# Patient Record
Sex: Female | Born: 1990 | Race: White | Hispanic: No | Marital: Single | Smoking: Never smoker
Health system: Southern US, Community
[De-identification: ages and names within clinical notes are randomized; demographics above are authoritative.]

---

## 2013-03-17 ENCOUNTER — Encounter (HOSPITAL_COMMUNITY): Payer: Self-pay | Admitting: Emergency Medicine

## 2013-03-17 ENCOUNTER — Emergency Department (HOSPITAL_COMMUNITY)
Admission: EM | Admit: 2013-03-17 | Discharge: 2013-03-17 | Disposition: A | Discharge status: Home / Self Care | Payer: Federal, State, Local not specified - PPO | Attending: Emergency Medicine | Admitting: Emergency Medicine

## 2013-03-17 DIAGNOSIS — H5789 Other specified disorders of eye and adnexa: Secondary | ICD-10-CM | POA: Insufficient documentation

## 2013-03-17 DIAGNOSIS — H5710 Ocular pain, unspecified eye: Secondary | ICD-10-CM | POA: Insufficient documentation

## 2013-03-17 MED ORDER — FLUORESCEIN SODIUM 1 MG OP STRP
1.0000 | ORAL_STRIP | Freq: Once | OPHTHALMIC | Status: AC
  Administered 2013-03-17: 1 via OPHTHALMIC
  Filled 2013-03-17: qty 1

## 2013-03-17 MED ORDER — POLYMYXIN B-TRIMETHOPRIM 10000-0.1 UNIT/ML-% OP SOLN
2.0000 [drp] | OPHTHALMIC | Status: AC
  Administered 2013-03-17: 2 [drp] via OPHTHALMIC
  Filled 2013-03-17: qty 10

## 2013-03-17 MED ORDER — TETRACAINE HCL 0.5 % OP SOLN
2.0000 [drp] | Freq: Once | OPHTHALMIC | Status: AC
  Administered 2013-03-17: 2 [drp] via OPHTHALMIC
  Filled 2013-03-17: qty 2

## 2013-03-17 NOTE — Discharge Instructions (Signed)
PLEASE FOLLOW UP WITH DR. SPENCER (CALL HIS OFFICE FOR AN APPT) IF YOUR SYMPTOMS WORSEN OR DO NOT IMPROVE SUBSTANTIALLY OVER THE NEXT 48 HRS.

## 2013-03-17 NOTE — ED Provider Notes (Signed)
CSN: [id number]     Arrival date & time 03/17/13  0421 History   First MD Initiated Contact with Patient 03/17/13 [phone]     Chief Complaint  Patient presents with  . Eye Pain     (Consider location/radiation/quality/duration/timing/severity/associated sxs/prior Treatment) HPI This patient is a very pleasant 23 year old woman who wears corrective lenses. She presents with complaints of foreign body sensation in the left eye. She was using an exfoliant facial cream and says that some of the abrasive substance got into her left eye. This occurred last night as she was getting ready for bed. She flushed her left eye with contact solution. However, she notes that she has not worn her contact lenses for several weeks.   The patient says she had a lingering FB sensation - "kind of stinging" so she felt she should get checked out. She denies any visual changes.   History reviewed. No pertinent past medical history. History reviewed. No pertinent past surgical history. Family History  Problem Relation Age of Onset  . Hyperlipidemia Father    History  Substance Use Topics  . Smoking status: Never Smoker   . Smokeless tobacco: Not on file  . Alcohol Use: Yes   OB History   Grav Para Term Preterm Abortions TAB SAB Ect Mult Living                 Review of Systems  Ten point review of symptoms performed and is negative with the exception of symptoms noted above.     Allergies  Review of patient's allergies indicates no known allergies.  Home Medications  No current outpatient prescriptions on file. BP 109/76  Pulse 99  Temp(Src) 96.9 F (36.1 C) (Oral)  Resp 18  SpO2 97%  LMP 03/03/2013 Physical Exam Gen: well developed and well nourished appearing Head: NCAT Eyes: PERL, EOMI, conjunctiva normal to inspection, sclera anicteric.  Nose: normal to inspection Neck: normal to inspection Skin: warm and dry Ext: normal to inspection,  Neuro: CN ii-xii grossly intact, no focal  deficits Psyche; normal affect,  calm and cooperative.   ED Course  Procedures (including critical care time)  Left eye anesthetized with 2 gtt of tetracaine opthalmic solution then painted with fluorocein strip. No uptake of flouro with Wood's lamp exam. No foreign body.    MDM   Left eye pain following foreign body exposure without signs of residual foreign body or corneal abrasion.    Brandt LoosenJulie Manly, MD 03/17/13 [phone]

## 2013-03-17 NOTE — ED Notes (Signed)
Pt states facial wash in left eye yesterday. Pt continues to complain of pain and feels as if fb in eye. Minimal redness noted. Pt denies increase in tearing, discharge, or visual changes.

## 2013-03-17 NOTE — ED Notes (Signed)
Pt alert x4. Respirations easy non labored. Denies complaints at this time.

## 2013-03-17 NOTE — ED Notes (Signed)
Pharmacy aware of polytrim order.

## 2014-03-17 ENCOUNTER — Other Ambulatory Visit: Payer: Self-pay | Admitting: Family Medicine

## 2014-03-17 ENCOUNTER — Other Ambulatory Visit: Payer: Self-pay | Admitting: Internal Medicine

## 2014-03-17 DIAGNOSIS — R102 Pelvic and perineal pain: Secondary | ICD-10-CM

## 2014-03-23 ENCOUNTER — Other Ambulatory Visit: Payer: Self-pay | Admitting: Family Medicine

## 2014-03-23 ENCOUNTER — Ambulatory Visit
Admission: RE | Admit: 2014-03-23 | Discharge: 2014-03-23 | Disposition: A | Discharge status: Home / Self Care | Payer: Federal, State, Local not specified - PPO | Source: Ambulatory Visit | Attending: Family Medicine | Admitting: Family Medicine

## 2014-03-23 DIAGNOSIS — R102 Pelvic and perineal pain: Secondary | ICD-10-CM

## 2014-03-23 DIAGNOSIS — Z30431 Encounter for routine checking of intrauterine contraceptive device: Secondary | ICD-10-CM

## 2014-03-23 DIAGNOSIS — N941 Unspecified dyspareunia: Secondary | ICD-10-CM

## 2015-08-12 IMAGING — US US TRANSVAGINAL NON-OB
1 series · 14 of 25 positions shown · non-contrast
Comparison: None

CLINICAL DATA: Pelvic pain and dyspareunia, check IUD placement



[Series 1: us transvaginal non-ob · 0.35mm/px · 14 of 64 slices shown]
[im 1/64]
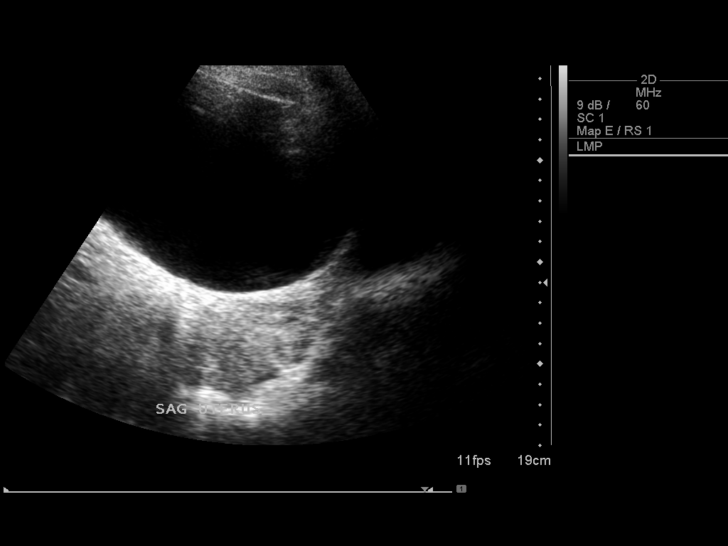
[im 6/64]
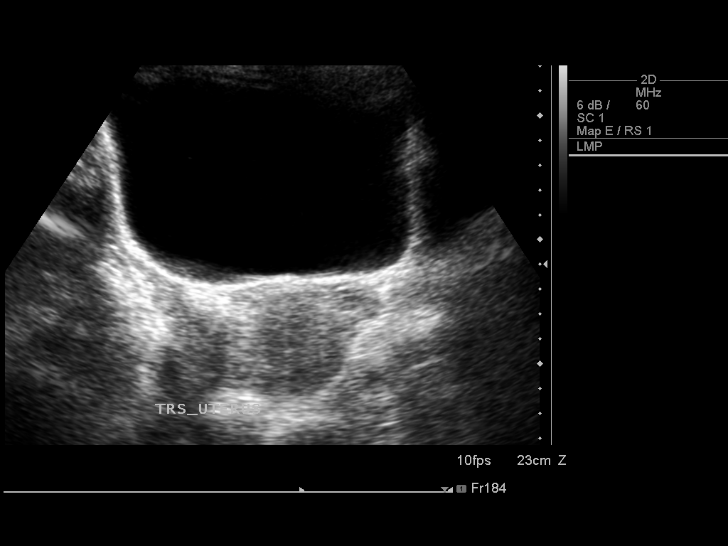
[im 11/64]
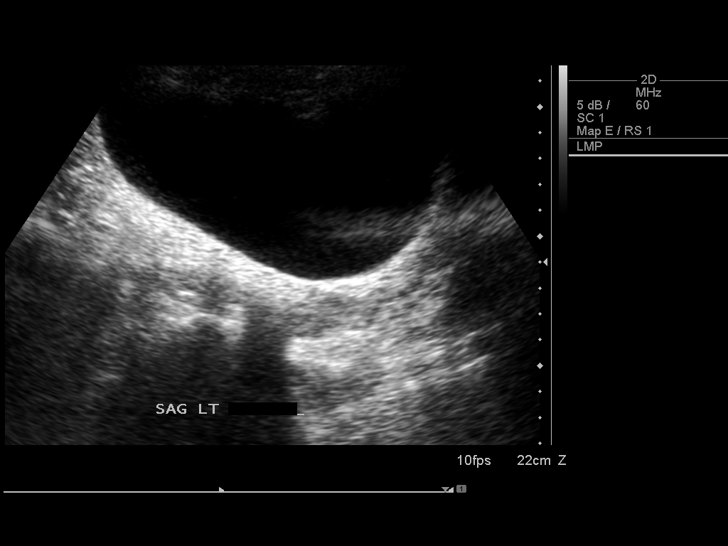
[im 16/64]
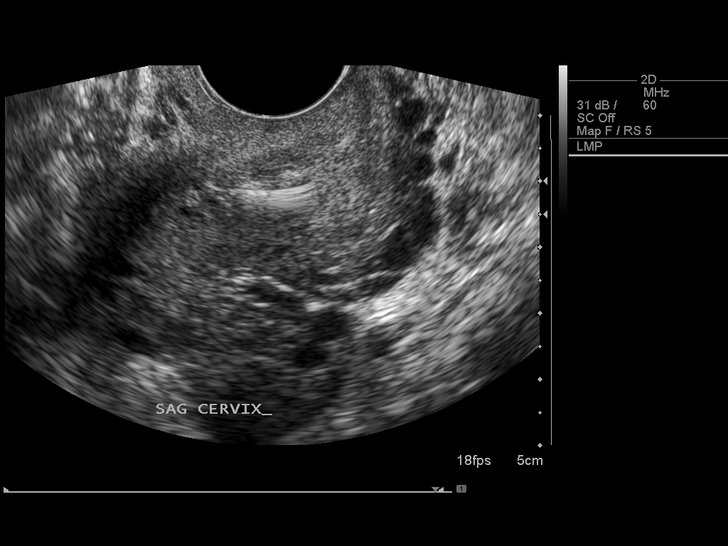
[im 22/64]
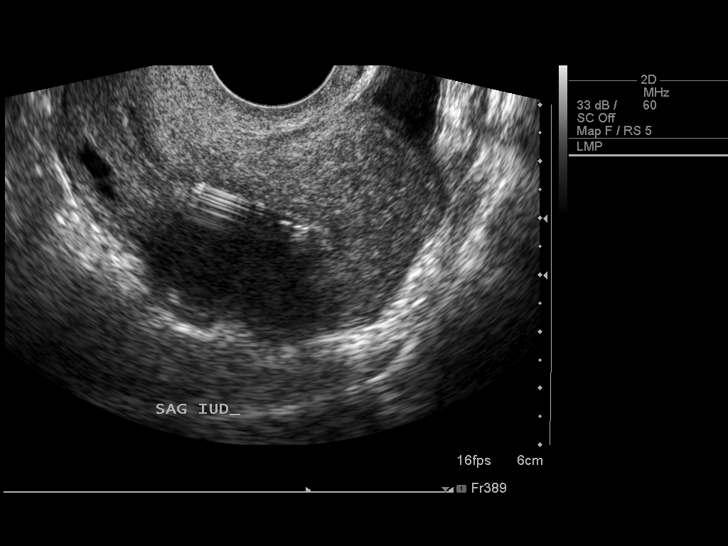
[im 24/64]
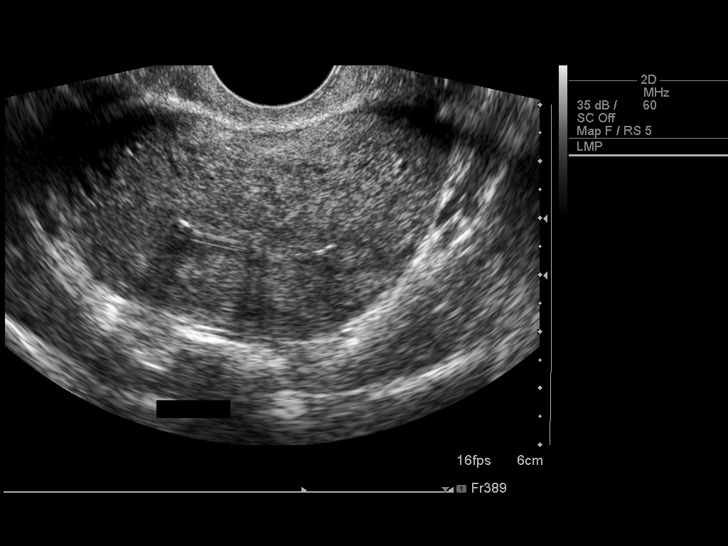
[im 29/64]
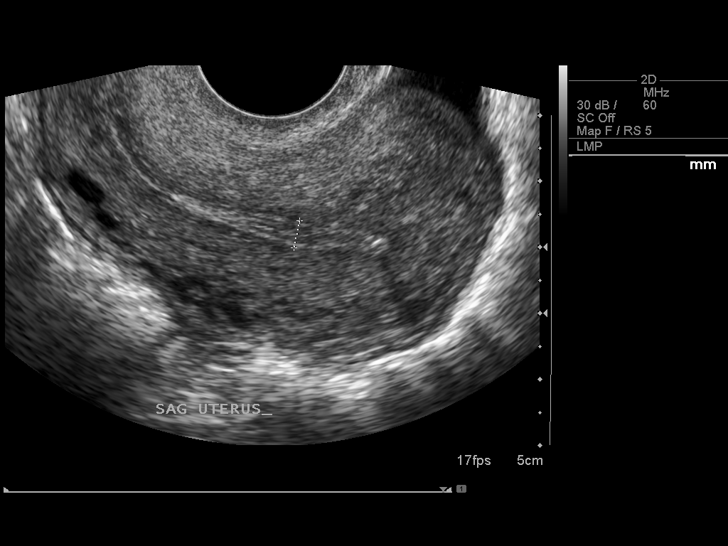
[im 35/64]
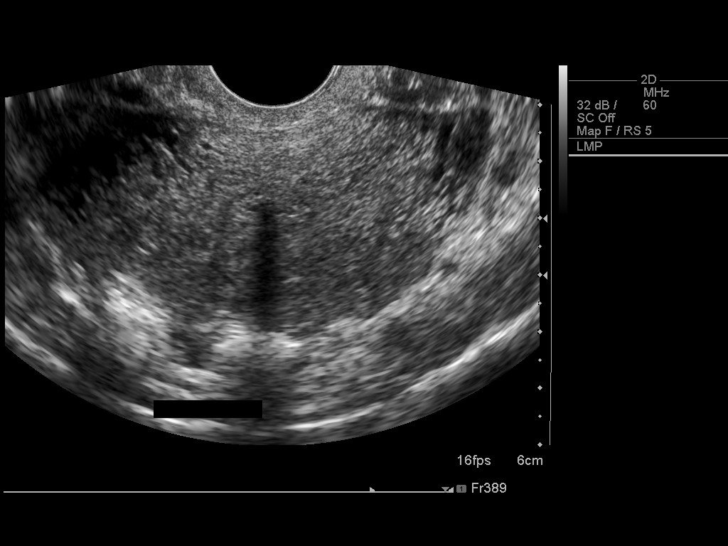
[im 40/64]
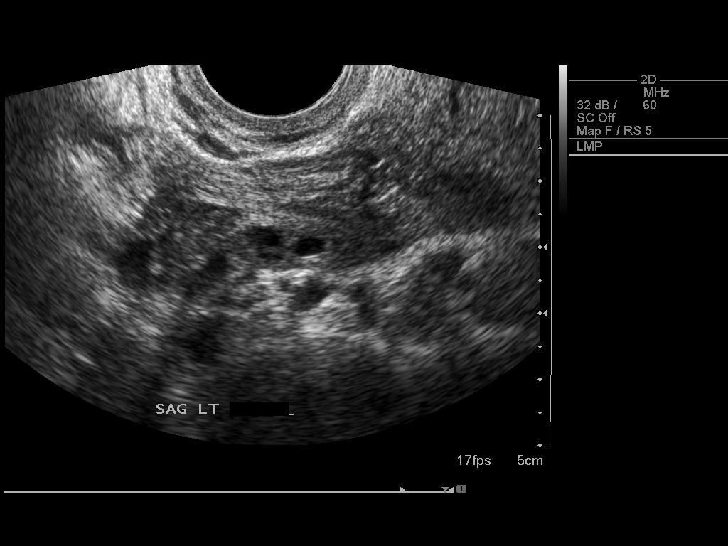
[im 43/64]
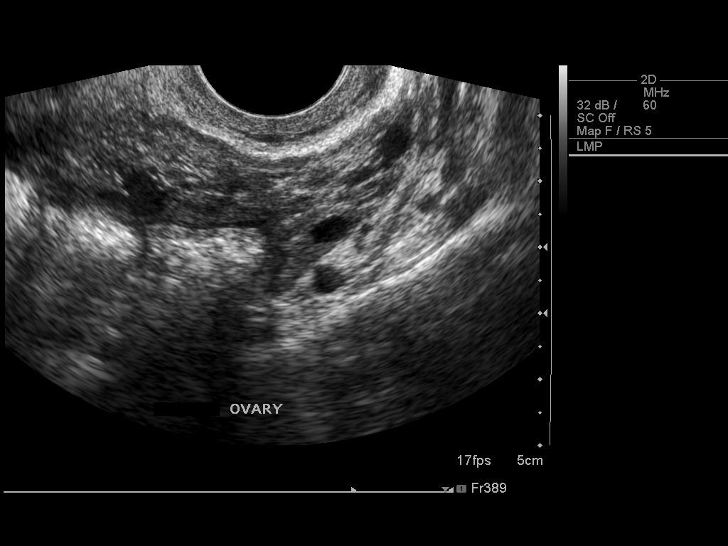
[im 48/64]
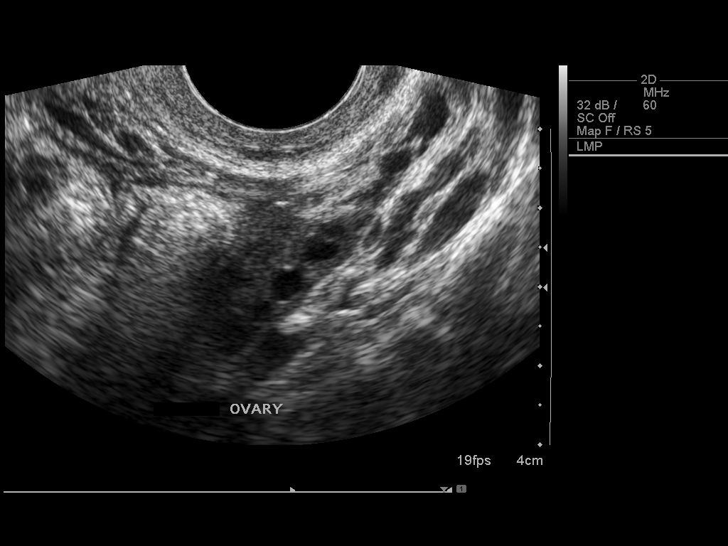
[im 53/64]
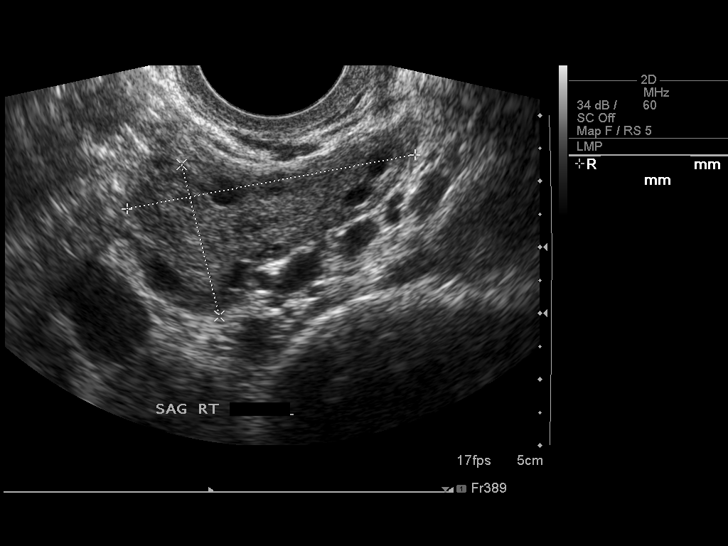
[im 58/64]
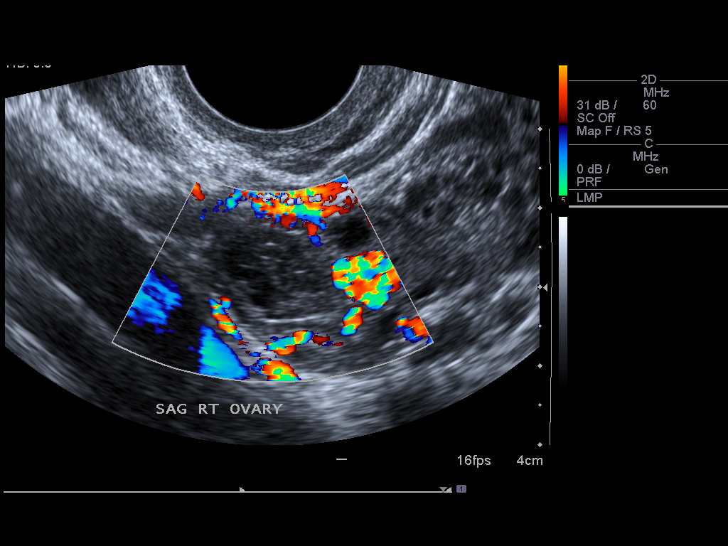
[im 64/64]
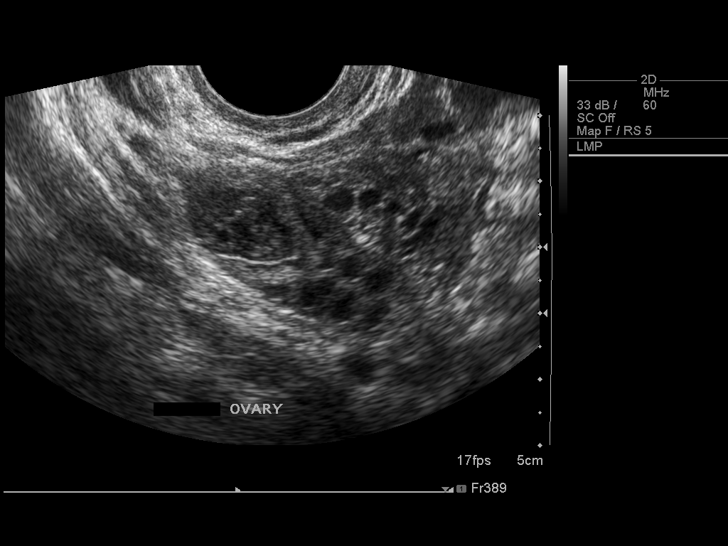

[14 of 25 positions shown; findings below may reference images not displayed]

FINDINGS: Uterus

Measurements: 7.4 x 4.5 x 6.4 cm.. Retroverted in position

Endometrium

Thickness: 4 mm. An IUD is noted in place.. No focal abnormality
visualized.

Right ovary

Measurements: 4.4 x 2.4 x 3.7 cm.. A complex structure measuring 2
cm is noted within the right ovary likely representing a collapsing
or hemorrhagic cyst.

Left ovary

Measurements: 2.8 x 1.5 x 2.1 cm.. Normal appearance/no adnexal
mass.

Other findings

Minimal free fluid is noted likely physiologic in nature.
IMPRESSION: IUD in satisfactory position.

Complex structure within the right ovary as described. Short-term
follow-up in 4-6 weeks is recommended to assess for resolution.
